# Patient Record
Sex: Male | Born: 1995 | Race: Black or African American | Hispanic: No | Marital: Single | Smoking: Current every day smoker
Health system: Southern US, Community
[De-identification: ages and names within clinical notes are randomized; demographics above are authoritative.]

---

## 2017-02-15 ENCOUNTER — Encounter (HOSPITAL_BASED_OUTPATIENT_CLINIC_OR_DEPARTMENT_OTHER): Payer: Self-pay | Admitting: *Deleted

## 2017-02-15 ENCOUNTER — Emergency Department (HOSPITAL_BASED_OUTPATIENT_CLINIC_OR_DEPARTMENT_OTHER): Payer: Self-pay

## 2017-02-15 ENCOUNTER — Other Ambulatory Visit: Payer: Self-pay

## 2017-02-15 ENCOUNTER — Emergency Department (HOSPITAL_BASED_OUTPATIENT_CLINIC_OR_DEPARTMENT_OTHER)
Admission: EM | Admit: 2017-02-15 | Discharge: 2017-02-15 | Disposition: A | Discharge status: Home / Self Care | Payer: Self-pay | Attending: Emergency Medicine | Admitting: Emergency Medicine

## 2017-02-15 DIAGNOSIS — F17210 Nicotine dependence, cigarettes, uncomplicated: Secondary | ICD-10-CM | POA: Insufficient documentation

## 2017-02-15 DIAGNOSIS — M545 Low back pain: Secondary | ICD-10-CM | POA: Insufficient documentation

## 2017-02-15 DIAGNOSIS — R0602 Shortness of breath: Secondary | ICD-10-CM | POA: Insufficient documentation

## 2017-02-15 DIAGNOSIS — F1210 Cannabis abuse, uncomplicated: Secondary | ICD-10-CM | POA: Insufficient documentation

## 2017-02-15 DIAGNOSIS — R0789 Other chest pain: Secondary | ICD-10-CM | POA: Insufficient documentation

## 2017-02-15 LAB — CBC WITH DIFFERENTIAL/PLATELET
Basophils Absolute: 0 10*3/uL (ref 0.0–0.1)
Basophils Relative: 1 %
Eosinophils Absolute: 0 10*3/uL (ref 0.0–0.7)
Eosinophils Relative: 1 %
HCT: 37.8 % — ABNORMAL LOW (ref 39.0–52.0)
Hemoglobin: 12.7 g/dL — ABNORMAL LOW (ref 13.0–17.0)
Lymphocytes Relative: 42 %
Lymphs Abs: 1.9 10*3/uL (ref 0.7–4.0)
MCH: 30.2 pg (ref 26.0–34.0)
MCHC: 33.6 g/dL (ref 30.0–36.0)
MCV: 90 fL (ref 78.0–100.0)
Monocytes Absolute: 0.6 10*3/uL (ref 0.1–1.0)
Monocytes Relative: 13 %
Neutro Abs: 2 10*3/uL (ref 1.7–7.7)
Neutrophils Relative %: 43 %
Platelets: 382 10*3/uL (ref 150–400)
RBC: 4.2 MIL/uL — ABNORMAL LOW (ref 4.22–5.81)
RDW: 13.3 % (ref 11.5–15.5)
WBC: 4.6 10*3/uL (ref 4.0–10.5)

## 2017-02-15 LAB — D-DIMER, QUANTITATIVE: D-Dimer, Quant: 0.38 ug/mL-FEU (ref 0.00–0.50)

## 2017-02-15 LAB — TROPONIN I: Troponin I: <0.03 ng/mL (ref <0.03)

## 2017-02-15 LAB — BASIC METABOLIC PANEL
Anion gap: 6 (ref 5–15)
BUN: 14 mg/dL (ref 6–20)
CO2: 26 mmol/L (ref 22–32)
Calcium: 9.3 mg/dL (ref 8.9–10.3)
Chloride: 102 mmol/L (ref 101–111)
Creatinine, Ser: 0.95 mg/dL (ref 0.61–1.24)
GFR calc Af Amer: >60 mL/min (ref >60)
GFR calc non Af Amer: >60 mL/min (ref >60)
Glucose, Bld: 110 mg/dL — ABNORMAL HIGH (ref 65–99)
Potassium: 4.4 mmol/L (ref 3.5–5.1)
Sodium: 134 mmol/L — ABNORMAL LOW (ref 135–145)

## 2017-02-15 MED ORDER — IBUPROFEN 400 MG PO TABS
600.0000 mg | ORAL_TABLET | Freq: Once | ORAL | Status: AC
  Administered 2017-02-15: 600 mg via ORAL
  Filled 2017-02-15: qty 1

## 2017-02-15 MED ORDER — CYCLOBENZAPRINE HCL 10 MG PO TABS: 10.0000 mg | ORAL_TABLET | Freq: Two times a day (BID) | ORAL | 0 refills | Status: AC | PRN

## 2017-02-15 MED ORDER — NAPROXEN 500 MG PO TABS: 500.0000 mg | ORAL_TABLET | Freq: Two times a day (BID) | ORAL | 0 refills | Status: AC

## 2017-02-15 MED ORDER — ACETAMINOPHEN 500 MG PO TABS: 500.0000 mg | ORAL_TABLET | Freq: Four times a day (QID) | ORAL | 0 refills | Status: AC | PRN

## 2017-02-15 NOTE — ED Provider Notes (Signed)
[facility] EMERGENCY DEPARTMENT Provider Note   CSN: [id number] Arrival date & time: 02/15/17  1053     History   Chief Complaint Chief Complaint  Patient presents with  . Chest Pain    HPI Brendan Flores is a 21 y.o. male who is previously healthy who presents with a one-month history of right-sided, midsternal chest pain.  His pain is worse with movement and deep breathing.  He reports he has had associated intermittent low back pain as well.  He does a lot of heavy lifting at work.  His chest pain is worse with lifting.  He reports intermittent shortness of breath, however none significant.  He denies any recent long trips, surgeries, known cancer, history of blood clots, new leg pain or swelling.  He denies cocaine use.  He reports he does smoke marijuana.  He has tried Tylenol at home for his symptoms, however has not had any relief.  HPI  History reviewed. No pertinent past medical history.  There are no active problems to display for this patient.   History reviewed. No pertinent surgical history.     Home Medications    Prior to Admission medications   Medication Sig Start Date End Date Taking? Authorizing Provider  acetaminophen (TYLENOL) 500 MG tablet Take 1 tablet (500 mg total) by mouth every 6 (six) hours as needed. 02/15/17   Johaan Ryser, Waylan BogaAlexandra M, PA-C  cyclobenzaprine (FLEXERIL) 10 MG tablet Take 1 tablet (10 mg total) by mouth 2 (two) times daily as needed for muscle spasms. 02/15/17   Breanne Olvera, Waylan BogaAlexandra M, PA-C  naproxen (NAPROSYN) 500 MG tablet Take 1 tablet (500 mg total) by mouth 2 (two) times daily. 02/15/17   Emi HolesLaw, Rieley Khalsa M, PA-C    Family History History reviewed. No pertinent family history.  Social History Social History   Tobacco Use  . Smoking status: Current Every Day Smoker    Packs/day: 1.00  . Smokeless tobacco: Never Used  Substance Use Topics  . Alcohol use: No    Frequency: Never  . Drug use: Yes    Types: Marijuana      Allergies   Patient has no known allergies.   Review of Systems Review of Systems  Constitutional: Negative for chills and fever.  HENT: Negative for facial swelling and sore throat.   Respiratory: Positive for shortness of breath (intermittent).   Cardiovascular: Positive for chest pain.  Gastrointestinal: Negative for abdominal pain, nausea and vomiting.  Genitourinary: Negative for dysuria.  Musculoskeletal: Positive for back pain (low, intermittent).  Skin: Negative for rash and wound.  Neurological: Negative for headaches.  Psychiatric/Behavioral: The patient is not nervous/anxious.      Physical Exam Updated Vital Signs BP 128/86   Pulse 63   Temp 98.3 F (36.8 C)   Resp (!) 22   SpO2 97%   Physical Exam  Constitutional: He appears well-developed and well-nourished. No distress.  HENT:  Head: Normocephalic and atraumatic.  Mouth/Throat: Oropharynx is clear and moist. No oropharyngeal exudate.  Eyes: Conjunctivae are normal. Pupils are equal, round, and reactive to light. Right eye exhibits no discharge. Left eye exhibits no discharge. No scleral icterus.  Neck: Normal range of motion. Neck supple. No thyromegaly present.  Cardiovascular: Normal rate, regular rhythm, normal heart sounds and intact distal pulses. Exam reveals no gallop and no friction rub.  No murmur heard. Pulmonary/Chest: Effort normal and breath sounds normal. No stridor. No respiratory distress. He has no wheezes. He has no rales. He exhibits tenderness.  Abdominal: Soft. Bowel sounds are normal. He exhibits no distension. There is no tenderness. There is no rebound and no guarding.  Musculoskeletal: He exhibits no edema.  No midline cervical, thoracic, or lumbar tenderness No paraspinal tenderness  Lymphadenopathy:    He has no cervical adenopathy.  Neurological: He is alert. Coordination normal.  Skin: Skin is warm and dry. No rash noted. He is not diaphoretic. No pallor.   Psychiatric: He has a normal mood and affect.  Nursing note and vitals reviewed.    ED Treatments / Results  Labs (all labs ordered are listed, but only abnormal results are displayed) Labs Reviewed  BASIC METABOLIC PANEL - Abnormal; Notable for the following components:      Result Value   Sodium 134 (*)    Glucose, Bld 110 (*)    All other components within normal limits  CBC WITH DIFFERENTIAL/PLATELET - Abnormal; Notable for the following components:   RBC 4.20 (*)    Hemoglobin 12.7 (*)    HCT 37.8 (*)    All other components within normal limits  D-DIMER, QUANTITATIVE (NOT AT [facility])  TROPONIN I    EKG  EKG Interpretation  Date/Time:  "Sunday February 15 2017 11:25:27 EST Ventricular Rate:  64 PR Interval:    QRS Duration: 86 QT Interval:  377 QTC Calculation: 389 R Axis:   37 Text Interpretation:  Sinus rhythm Consider right ventricular hypertrophy Lateral infarct, acute Borderline ST elevation, anterior leads S1-S2-S3 pattern, consider pulmonary disease, RVH, or normal variant No previous ECGs available Confirmed by Little, Rachel ([id number]) on 02/15/2017 11:31:00 AM       Radiology Dg Chest 2 View  Result Date: 02/15/2017 CLINICAL DATA:  Midsternal chest pain for the past month. EXAM: CHEST  2 VIEW COMPARISON:  None. FINDINGS: The heart size and mediastinal contours are within normal limits. Both lungs are clear. The visualized skeletal structures are unremarkable. IMPRESSION: Normal chest x-ray. Electronically Signed   By: William T Derry M.D.   On: 02/15/2017 11:45    Procedures Procedures (including critical care time)  Medications Ordered in ED Medications  ibuprofen (ADVIL,MOTRIN) tablet 600 mg (600 mg Oral Given 02/15/17 1146)     Initial Impression / Assessment and Plan / ED Course  I have reviewed the triage vital signs and the nursing notes.  Pertinent labs & imaging results that were available during my care of the patient were reviewed by me and  considered in my medical decision making (see chart for details).     Patient with most likely musculoskeletal versus costochondritis.  EKG was concerning for ST elevation in S1 Q3 T3, however could be early re-pole considering patient is a young, thin male.  Labs are within normal limits.  D-dimer and troponin are negative.  Chest x-ray is negative.  Will discharge patient home with anti-inflammatory pain medication, supportive treatment including ice and heat, and patient advised to rest, especially avoiding heavy lifting for the next few days.  Return precautions discussed.  Patient understands and agrees with plan.  Patient vitals stable and discharged in satisfactory condition. I discussed patient case with Dr. Little who guided the patient's management and agrees with plan.   Final Clinical Impressions(s) / ED Diagnoses   Final diagnoses:  Chest wall pain    ED Discharge Orders        Ordered    naproxen (NAPROSYN) 500 MG tablet  2 times daily     12" /02/18 1308    acetaminophen (TYLENOL) 500 MG tablet  Every 6 hours PRN     02/15/17 1308    cyclobenzaprine (FLEXERIL) 10 MG tablet  2 times daily PRN     02/15/17 1308       Emi Holes, PA-C 02/15/17 1613    Little, Ambrose Finland, MD 02/15/17 2102

## 2017-02-15 NOTE — Discharge Instructions (Signed)
Medications: Naprosyn, Tylenol, Flexeril  Treatment: Take Naprosyn twice daily for 10 days. You can alternate with Tylenol as prescribed. Take Flexeril twice daily as needed for muscle pain and spasms.  Use ice and heating pad 3-4 times daily alternating 20 minutes on, 20 minutes off.  Take a few days off of work to rest your muscles.  Follow-up: Please follow-up and establish care with a primary care provider by calling the number circled on your discharge paperwork.

## 2017-02-15 NOTE — ED Triage Notes (Addendum)
Pt c/o mid sternal chest pain and back pain x 1 week , pt states chest pain with movt , heavy lifting at work

## 2019-05-31 IMAGING — CR DG CHEST 2V
2 series · 2 of 2 positions shown · non-contrast
Comparison: None.

CLINICAL DATA: Midsternal chest pain for the past month.

EXAM:
CHEST  2 VIEW

[w chest pa]
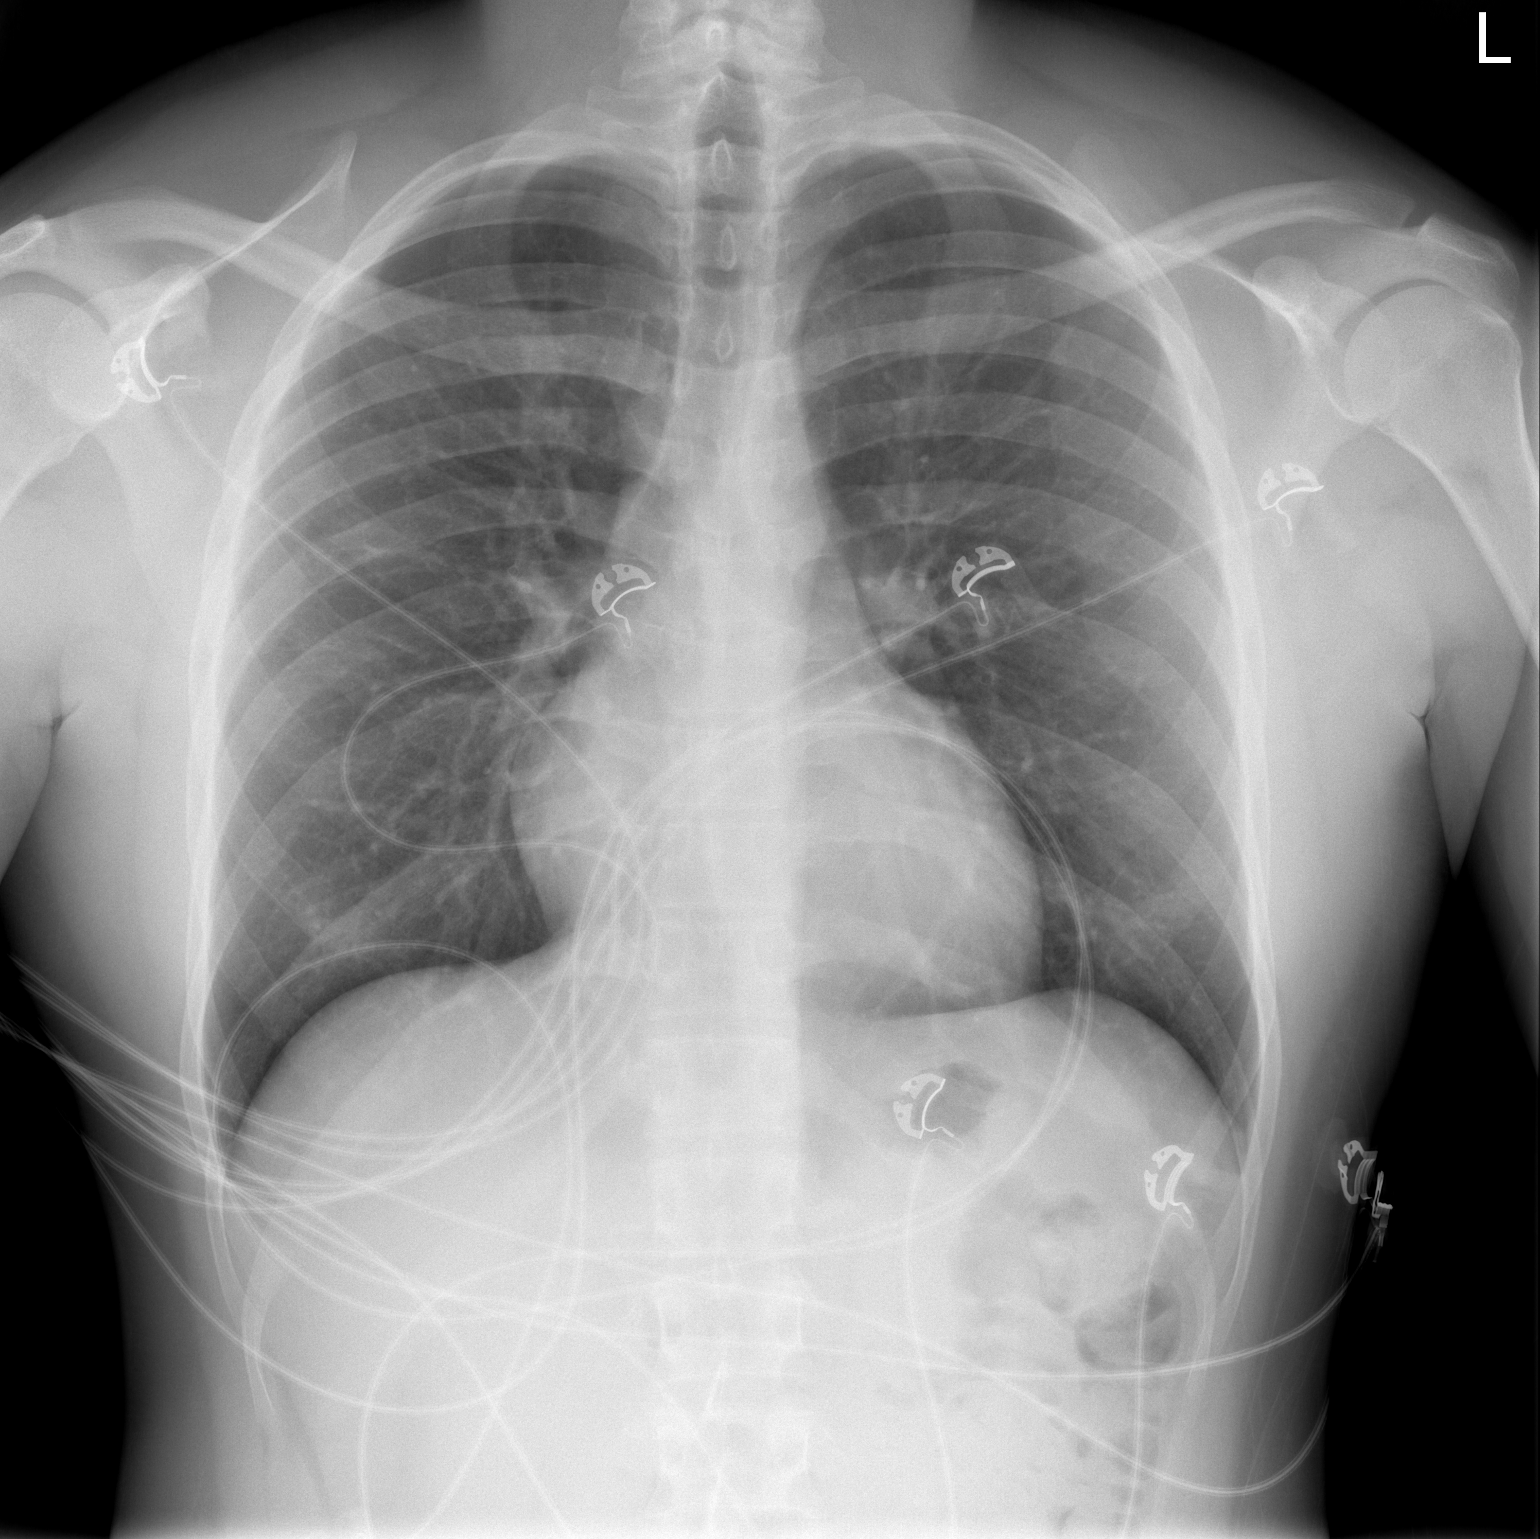

[w chest lat]
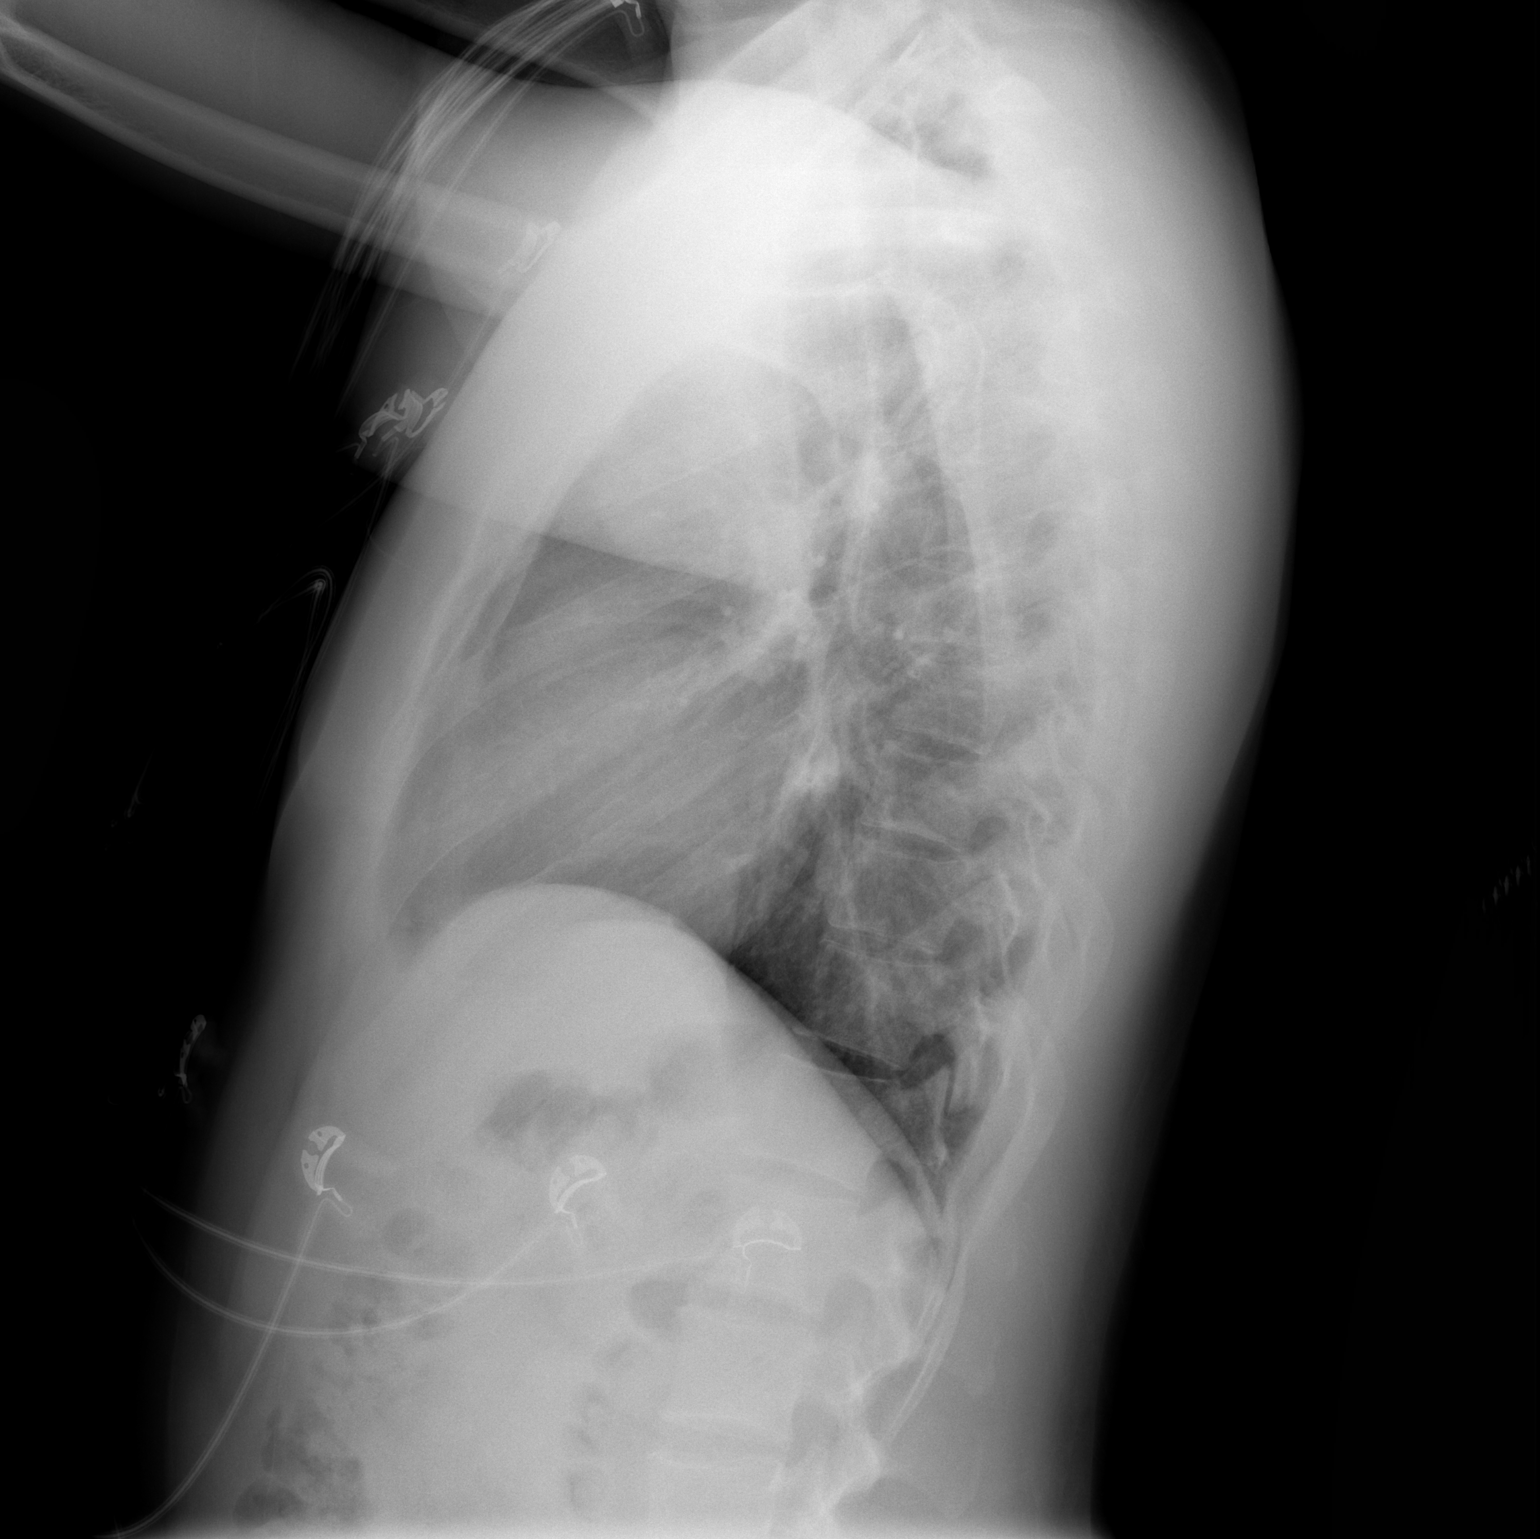

[2 of 2 positions shown; findings below may reference images not displayed]

FINDINGS: The heart size and mediastinal contours are within normal limits.
Both lungs are clear. The visualized skeletal structures are
unremarkable.
IMPRESSION: Normal chest x-ray.

## 2021-12-20 ENCOUNTER — Other Ambulatory Visit (HOSPITAL_BASED_OUTPATIENT_CLINIC_OR_DEPARTMENT_OTHER): Payer: Self-pay

## 2021-12-20 ENCOUNTER — Emergency Department (HOSPITAL_BASED_OUTPATIENT_CLINIC_OR_DEPARTMENT_OTHER)
Admission: EM | Admit: 2021-12-20 | Discharge: 2021-12-20 | Disposition: A | Discharge status: Home / Self Care | Payer: Medicaid Other | Attending: Emergency Medicine | Admitting: Emergency Medicine

## 2021-12-20 ENCOUNTER — Other Ambulatory Visit: Payer: Self-pay

## 2021-12-20 ENCOUNTER — Encounter (HOSPITAL_BASED_OUTPATIENT_CLINIC_OR_DEPARTMENT_OTHER): Payer: Self-pay | Admitting: Emergency Medicine

## 2021-12-20 DIAGNOSIS — X58XXXA Exposure to other specified factors, initial encounter: Secondary | ICD-10-CM | POA: Insufficient documentation

## 2021-12-20 DIAGNOSIS — Y92000 Kitchen of unspecified non-institutional (private) residence as  the place of occurrence of the external cause: Secondary | ICD-10-CM | POA: Insufficient documentation

## 2021-12-20 DIAGNOSIS — S0502XA Injury of conjunctiva and corneal abrasion without foreign body, left eye, initial encounter: Secondary | ICD-10-CM | POA: Diagnosis not present

## 2021-12-20 DIAGNOSIS — H5712 Ocular pain, left eye: Secondary | ICD-10-CM | POA: Diagnosis present

## 2021-12-20 MED ORDER — TETRACAINE HCL 0.5 % OP SOLN
2.0000 [drp] | Freq: Once | OPHTHALMIC | Status: AC
  Administered 2021-12-20: 2 [drp] via OPHTHALMIC
  Filled 2021-12-20: qty 4

## 2021-12-20 MED ORDER — POLYMYXIN B-TRIMETHOPRIM 10000-0.1 UNIT/ML-% OP SOLN
1.0000 [drp] | OPHTHALMIC | 0 refills | Status: AC
  Filled 2021-12-20: qty 10, 34d supply, fill #0

## 2021-12-20 MED ORDER — FLUORESCEIN SODIUM 1 MG OP STRP
1.0000 | ORAL_STRIP | Freq: Once | OPHTHALMIC | Status: AC
  Administered 2021-12-20: 1 via OPHTHALMIC
  Filled 2021-12-20: qty 1

## 2021-12-20 MED ORDER — POLYMYXIN B-TRIMETHOPRIM 10000-0.1 UNIT/ML-% OP SOLN
1.0000 [drp] | OPHTHALMIC | Status: DC
  Administered 2021-12-20: 1 [drp] via OPHTHALMIC

## 2021-12-20 NOTE — ED Provider Notes (Signed)
[facility] EMERGENCY DEPARTMENT Provider Note   CSN: [id number] Arrival date & time: 12/20/21  2778     History  Chief Complaint  Patient presents with   Eye Drainage    Brendan Flores is a 26 y.o. male.  26 yo M with a chief complaints of left eye pain and drainage.  This been going on for about a week now.  Initially thought something had gotten into it and got irritated.  He has been rubbing it a bit but without improvement.  Sometimes it is blurry off and on but not currently.  Denies any obvious trauma to the eye.  Denies woodworking or welding.  He works in a [employer].  Does not wear contact lenses.        Home Medications Prior to Admission medications   Medication Sig Start Date End Date Taking? Authorizing Provider  trimethoprim-polymyxin b (POLYTRIM) ophthalmic solution Place 1 drop into the right eye every 4 (four) hours. 12/20/21  Yes Deno Etienne, DO  acetaminophen (TYLENOL) 500 MG tablet Take 1 tablet (500 mg total) by mouth every 6 (six) hours as needed. 02/15/17   Law, Bea Graff, PA-C  cyclobenzaprine (FLEXERIL) 10 MG tablet Take 1 tablet (10 mg total) by mouth 2 (two) times daily as needed for muscle spasms. 02/15/17   Law, Bea Graff, PA-C  naproxen (NAPROSYN) 500 MG tablet Take 1 tablet (500 mg total) by mouth 2 (two) times daily. 02/15/17   Frederica Kuster, PA-C      Allergies    Patient has no known allergies.    Review of Systems   Review of Systems  Physical Exam Updated Vital Signs BP (!) 134/97 (BP Location: Left Arm)   Pulse 67   Temp 97.7 F (36.5 C) (Oral)   Resp 17   Ht 5\' 3"  (1.6 m)   Wt 80.5 kg   SpO2 100%   BMI 31.44 kg/m  Physical Exam Vitals and nursing note reviewed.  Constitutional:      Appearance: He is well-developed.  HENT:     Head: Normocephalic and atraumatic.  Eyes:     General: Lids are everted, no foreign bodies appreciated.     Intraocular pressure: Left eye pressure is 13 mmHg.     Pupils: Pupils  are equal, round, and reactive to light.     Comments: Conjunctival injection to the left eye.  Extraocular motors intact.  Pupils equal round and reactive to light and accommodation.  No obvious gross foreign body.  Fluorescein without obvious uptake.  Neck:     Vascular: No JVD.  Cardiovascular:     Rate and Rhythm: Normal rate and regular rhythm.     Heart sounds: No murmur heard.    No friction rub. No gallop.  Pulmonary:     Effort: No respiratory distress.     Breath sounds: No wheezing.  Abdominal:     General: There is no distension.     Tenderness: There is no abdominal tenderness. There is no guarding or rebound.  Musculoskeletal:        General: Normal range of motion.     Cervical back: Normal range of motion and neck supple.  Skin:    Coloration: Skin is not pale.     Findings: No rash.  Neurological:     Mental Status: He is alert and oriented to person, place, and time.  Psychiatric:        Behavior: Behavior normal.     ED Results /  Procedures / Treatments   Labs (all labs ordered are listed, but only abnormal results are displayed) Labs Reviewed - No data to display  EKG None  Radiology No results found.  Procedures Procedures    Medications Ordered in ED Medications  fluorescein ophthalmic strip 1 strip (1 strip Left Eye Given by Other 12/20/21 0756)  tetracaine (PONTOCAINE) 0.5 % ophthalmic solution 2 drop (2 drops Left Eye Given by Other 12/20/21 0756)    ED Course/ Medical Decision Making/ A&P                           Medical Decision Making Risk Prescription drug management.   26 yo M with a chief complaints of left eye pain and irritation this has been going on for about a week now.  Likely irritated by the patient's rubbing it.  Will stay in the eye and assess for corneal abrasion.  Check intraocular pressure.  Patient's intraocular pressure is normal.  No obvious corneal abrasion.  No obvious foreign body.  Will start on antibiotic  eyedrops.  Given ophthalmology follow-up.  8:07 AM:  I have discussed the diagnosis/risks/treatment options with the patient and family.  Evaluation and diagnostic testing in the emergency department does not suggest an emergent condition requiring admission or immediate intervention beyond what has been performed at this time.  They will follow up with  Ophtho. We also discussed returning to the ED immediately if new or worsening sx occur. We discussed the sx which are most concerning (e.g., sudden worsening pain, fever, inability to tolerate by mouth) that necessitate immediate return. Medications administered to the patient during their visit and any new prescriptions provided to the patient are listed below.  Medications given during this visit Medications  fluorescein ophthalmic strip 1 strip (1 strip Left Eye Given by Other 12/20/21 0756)  tetracaine (PONTOCAINE) 0.5 % ophthalmic solution 2 drop (2 drops Left Eye Given by Other 12/20/21 0756)     The patient appears reasonably screen and/or stabilized for discharge and I doubt any other medical condition or other [facility] requiring further screening, evaluation, or treatment in the ED at this time prior to discharge.          Final Clinical Impression(s) / ED Diagnoses Final diagnoses:  Abrasion of left cornea, initial encounter    Rx / DC Orders ED Discharge Orders          Ordered    trimethoprim-polymyxin b (POLYTRIM) ophthalmic solution  Every 4 hours        12/20/21 0806              Melene Plan, DO 12/20/21 [id number]

## 2021-12-20 NOTE — ED Triage Notes (Signed)
Pain and drainage   Left eye, photophobia,

## 2021-12-20 NOTE — Discharge Instructions (Signed)
Do not rub the eye.  If you have the sensation around the eye then apply a warm washcloth to the area.  I have prescribed you antibiotic eyedrops.  Please use them as prescribed.  Follow-up with ophthalmologist in the office.

## 2023-09-07 ENCOUNTER — Other Ambulatory Visit (HOSPITAL_BASED_OUTPATIENT_CLINIC_OR_DEPARTMENT_OTHER): Payer: Self-pay
